# Patient Record
Sex: Male | Born: 1965 | Race: White | Hispanic: No | Marital: Married
Health system: Southern US, Community
[De-identification: ages and names within clinical notes are randomized; demographics above are authoritative.]

---

## 2017-01-13 DIAGNOSIS — L089 Local infection of the skin and subcutaneous tissue, unspecified: Secondary | ICD-10-CM | POA: Diagnosis not present

## 2017-01-13 DIAGNOSIS — Z1389 Encounter for screening for other disorder: Secondary | ICD-10-CM | POA: Diagnosis not present

## 2017-01-13 DIAGNOSIS — F4320 Adjustment disorder, unspecified: Secondary | ICD-10-CM | POA: Diagnosis not present

## 2017-01-13 DIAGNOSIS — L723 Sebaceous cyst: Secondary | ICD-10-CM | POA: Diagnosis not present

## 2017-01-15 DIAGNOSIS — L0211 Cutaneous abscess of neck: Secondary | ICD-10-CM | POA: Diagnosis not present

## 2017-01-15 DIAGNOSIS — L03221 Cellulitis of neck: Secondary | ICD-10-CM | POA: Diagnosis not present

## 2017-04-27 DIAGNOSIS — Z6825 Body mass index (BMI) 25.0-25.9, adult: Secondary | ICD-10-CM | POA: Diagnosis not present

## 2017-04-27 DIAGNOSIS — F4320 Adjustment disorder, unspecified: Secondary | ICD-10-CM | POA: Diagnosis not present

## 2017-04-27 DIAGNOSIS — K5090 Crohn's disease, unspecified, without complications: Secondary | ICD-10-CM | POA: Diagnosis not present

## 2017-11-16 DIAGNOSIS — F4320 Adjustment disorder, unspecified: Secondary | ICD-10-CM | POA: Diagnosis not present

## 2017-11-16 DIAGNOSIS — J101 Influenza due to other identified influenza virus with other respiratory manifestations: Secondary | ICD-10-CM | POA: Diagnosis not present

## 2018-03-19 DIAGNOSIS — L820 Inflamed seborrheic keratosis: Secondary | ICD-10-CM | POA: Diagnosis not present

## 2018-05-11 DIAGNOSIS — Z6826 Body mass index (BMI) 26.0-26.9, adult: Secondary | ICD-10-CM | POA: Diagnosis not present

## 2018-05-11 DIAGNOSIS — K5090 Crohn's disease, unspecified, without complications: Secondary | ICD-10-CM | POA: Diagnosis not present

## 2018-05-11 DIAGNOSIS — R1032 Left lower quadrant pain: Secondary | ICD-10-CM | POA: Diagnosis not present

## 2018-05-11 DIAGNOSIS — M722 Plantar fascial fibromatosis: Secondary | ICD-10-CM | POA: Diagnosis not present

## 2018-05-30 DIAGNOSIS — Z01818 Encounter for other preprocedural examination: Secondary | ICD-10-CM | POA: Diagnosis not present

## 2019-06-20 ENCOUNTER — Other Ambulatory Visit (HOSPITAL_COMMUNITY): Payer: Self-pay | Admitting: Neurological Surgery

## 2019-06-20 ENCOUNTER — Other Ambulatory Visit: Payer: Self-pay | Admitting: Neurological Surgery

## 2019-06-20 DIAGNOSIS — G8928 Other chronic postprocedural pain: Secondary | ICD-10-CM

## 2019-07-17 ENCOUNTER — Ambulatory Visit (HOSPITAL_COMMUNITY)
Admission: RE | Admit: 2019-07-17 | Discharge: 2019-07-17 | Disposition: A | Discharge status: Home / Self Care | Payer: No Typology Code available for payment source | Source: Ambulatory Visit | Attending: Neurological Surgery | Admitting: Neurological Surgery

## 2019-07-17 ENCOUNTER — Other Ambulatory Visit: Payer: Self-pay

## 2019-07-17 DIAGNOSIS — M5106 Intervertebral disc disorders with myelopathy, lumbar region: Secondary | ICD-10-CM | POA: Insufficient documentation

## 2019-07-17 DIAGNOSIS — K760 Fatty (change of) liver, not elsewhere classified: Secondary | ICD-10-CM | POA: Diagnosis not present

## 2019-07-17 DIAGNOSIS — K8020 Calculus of gallbladder without cholecystitis without obstruction: Secondary | ICD-10-CM | POA: Diagnosis not present

## 2019-07-17 DIAGNOSIS — M50021 Cervical disc disorder at C4-C5 level with myelopathy: Secondary | ICD-10-CM | POA: Diagnosis not present

## 2019-07-17 DIAGNOSIS — M4716 Other spondylosis with myelopathy, lumbar region: Secondary | ICD-10-CM | POA: Diagnosis present

## 2019-07-17 DIAGNOSIS — G8928 Other chronic postprocedural pain: Secondary | ICD-10-CM

## 2019-07-17 DIAGNOSIS — M4712 Other spondylosis with myelopathy, cervical region: Secondary | ICD-10-CM | POA: Insufficient documentation

## 2019-07-17 LAB — C-REACTIVE PROTEIN: CRP: 0.5 mg/dL (ref <1.0)

## 2019-07-17 LAB — SEDIMENTATION RATE: Sed Rate: 1 mm/hr (ref 0–16)

## 2019-07-17 MED ORDER — DIAZEPAM 5 MG PO TABS
ORAL_TABLET | ORAL | Status: AC
  Administered 2019-07-17: 10 mg via ORAL
  Filled 2019-07-17: qty 2

## 2019-07-17 MED ORDER — IOHEXOL 300 MG/ML  SOLN
10.0000 mL | Freq: Once | INTRAMUSCULAR | Status: AC | PRN
  Administered 2019-07-17: 7 mL via INTRATHECAL

## 2019-07-17 MED ORDER — DEXAMETHASONE 2 MG PO TABS
4.0000 mg | ORAL_TABLET | Freq: Once | ORAL | Status: AC
  Administered 2019-07-17: 4 mg via ORAL
  Filled 2019-07-17: qty 2

## 2019-07-17 MED ORDER — ONDANSETRON HCL 4 MG/2ML IJ SOLN: 4.0000 mg | Freq: Four times a day (QID) | INTRAMUSCULAR | Status: DC | PRN

## 2019-07-17 MED ORDER — DIAZEPAM 5 MG PO TABS: 10.0000 mg | ORAL_TABLET | Freq: Once | ORAL | Status: AC

## 2019-07-17 MED ORDER — LIDOCAINE HCL (PF) 1 % IJ SOLN
5.0000 mL | Freq: Once | INTRAMUSCULAR | Status: AC
  Administered 2019-07-17: 5 mL via INTRADERMAL

## 2019-07-17 MED ORDER — HYDROCODONE-ACETAMINOPHEN 5-325 MG PO TABS
1.0000 | ORAL_TABLET | ORAL | Status: DC | PRN
  Administered 2019-07-17: 2 via ORAL
  Filled 2019-07-17: qty 2

## 2019-07-17 NOTE — Procedures (Signed)
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  Back in December he underwent decompression at C5-6 and C6-C7 with an arthroplasty at those levels.  He tolerated surgery well but has had persistent neck pain and now has back pain with weakness in his lower extremities.  Because of the presence of the arthroplasties in his neck further study has been suggested and a myelogram post myelogram CAT scan is being performed to check the degree of decompression instability throughout his entire neural axis not only the cervical spine but also the thoracic and lumbar spines.  There is a family history of significant autoimmune phenomenon and there is concerned that he may have ankylosing spondylitis also and this is currently being worked up with a few lab tests.  Pre op Dx: Cervical spondylosis, lumbar spondylosis with myelopathy.  Cervical arthroplasty C5-6 and C6-C7 Post op Dx: Same Procedure: Total myelogram Surgeon: Mckynlie Vanderslice Puncture level: L3-4 Fluid color: Clear colorless Injection: Iohexol 300, 7 mL Findings: Moderate spondylosis in the cervical spine mild spondylitic changes in the lumbar spine further work-up with CT scanning arthroplasty is in place at C5-6 and C6-C7.

## 2019-07-17 NOTE — Discharge Instructions (Signed)
Myelogram and Lumbar Puncture Discharge Instructions  1. Go home and rest quietly for the next 24 hours.  It is important to lie flat for the next 24 hours.  Get up only to go to the restroom.  You may lie in the bed or on a couch on your back, your stomach, your left side or your right side.  You may have one pillow under your head.  You may have pillows between your knees while you are on your side or under your knees while you are on your back.  2. DO NOT drive today.  Recline the seat as far back as it will go, while still wearing your seat belt, on the way home.  3. You may get up to go to the bathroom as needed.  You may sit up for 10 minutes to eat.  You may resume your normal diet and medications unless otherwise indicated.  4. The incidence of headache, nausea, or vomiting is about 5% (one in 20 patients).  If you develop a headache, lie flat and drink plenty of fluids until the headache goes away.  Caffeinated beverages may be helpful.  If you develop severe nausea and vomiting or a headache that does not go away with flat bed rest, call [phone].  5. You may resume normal activities after your 24 hours of bed rest is over; however, do not exert yourself strongly or do any heavy lifting tomorrow.  6. Call your physician for a follow-up appointment.  The results of your myelogram will be sent directly to your physician by the following day.  7. If you have any questions or if complications develop after you arrive home, please call [phone].  Discharge instructions have been explained to the patient.  The patient, or the person responsible for the patient, fully understands these instructions.   

## 2020-10-22 IMAGING — CT CT CERVICAL SPINE W/ CM
3 of 4 series · 12 of 35 positions shown, 14 images · non-contrast
Comparison: none

CLINICAL DATA: Cervical and back pain. Right-sided extremity
weakness. History of Crohn's disease.
TECHNIQUE: Contiguous axial images were obtained through the Cervical,
Thoracic, and Lumbar spine after the intrathecal infusion of
infusion. Coronal and sagittal reconstructions were obtained of the
axial image sets.

[Series 9: sag bone · sagittal · 0.30mm/px · 5 of 214 slices shown, 6 images]
[im 72/214  bone]
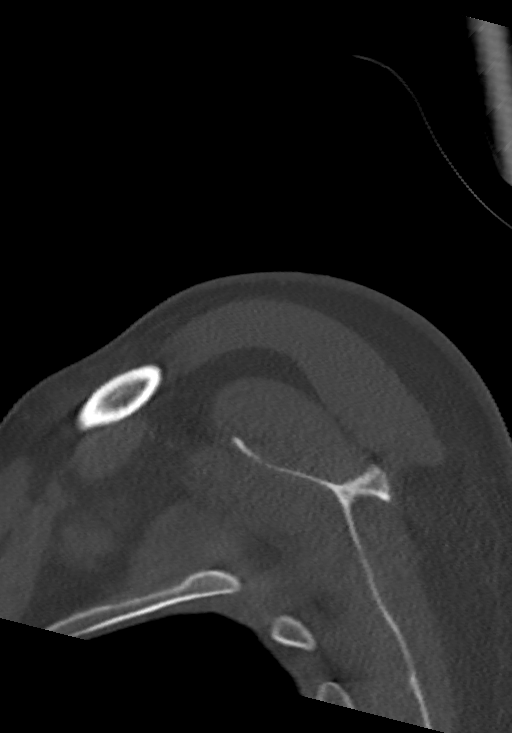
[im 89/214  bone]
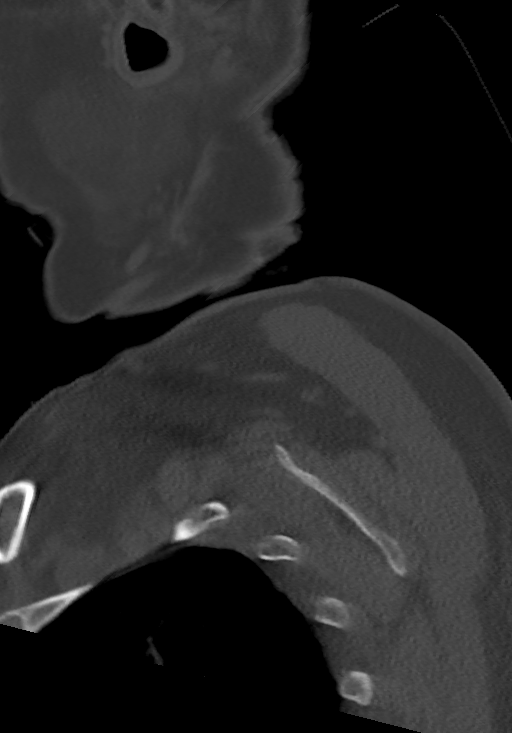
[im 107/214  soft-tissue]
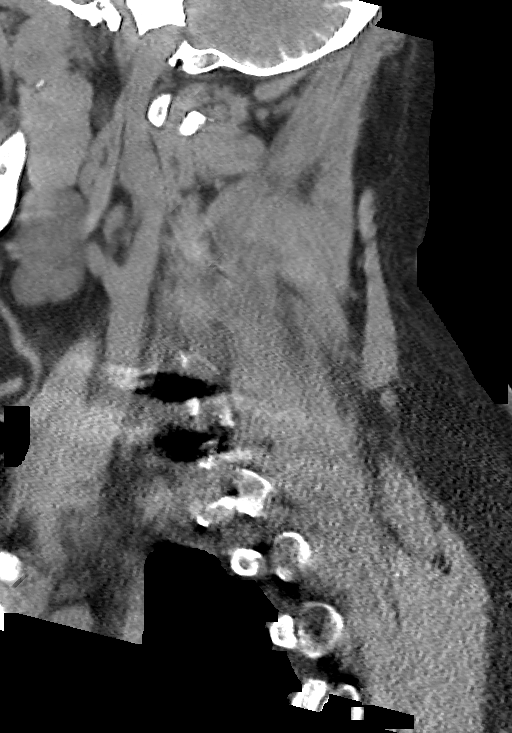
[im 107/214  bone]
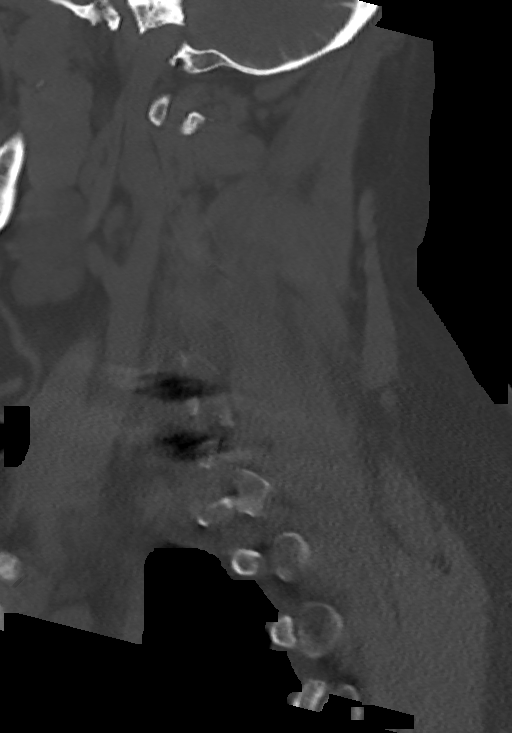
[im 125/214  bone]
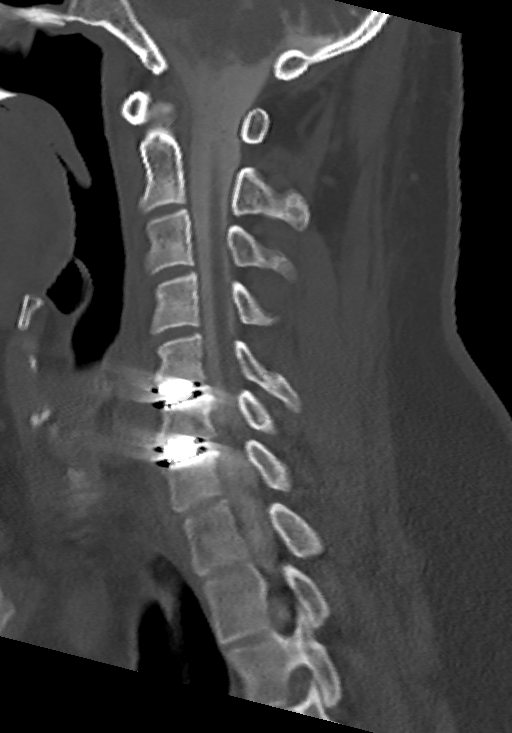
[im 143/214  bone]
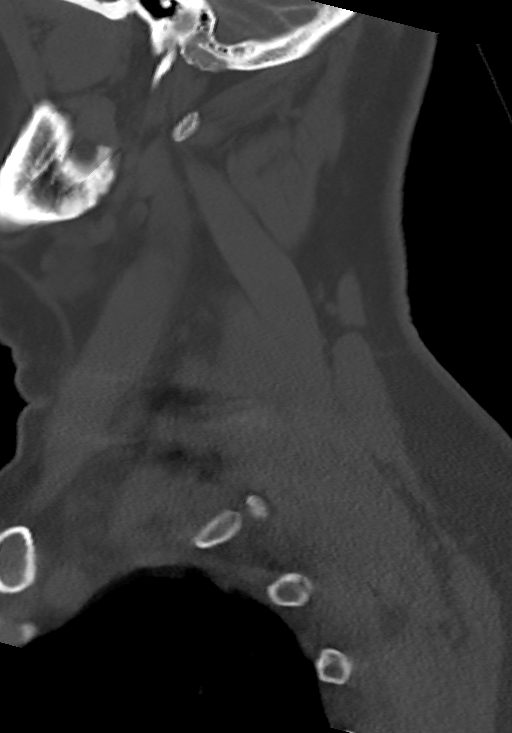

[Series 10: cor bone · coronal · 0.43mm/px · 3 of 77 slices shown]
[im 16/77  bone]
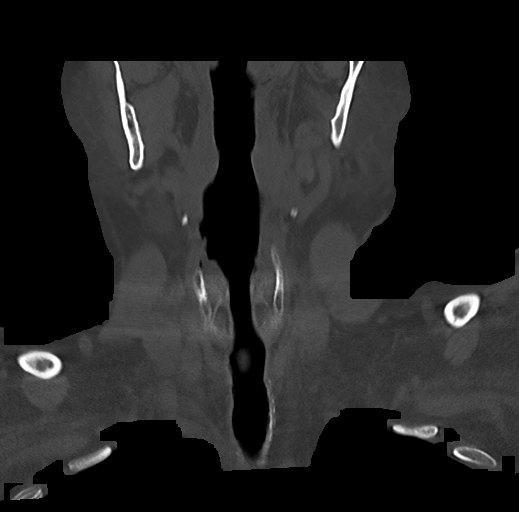
[im 31/77  bone]
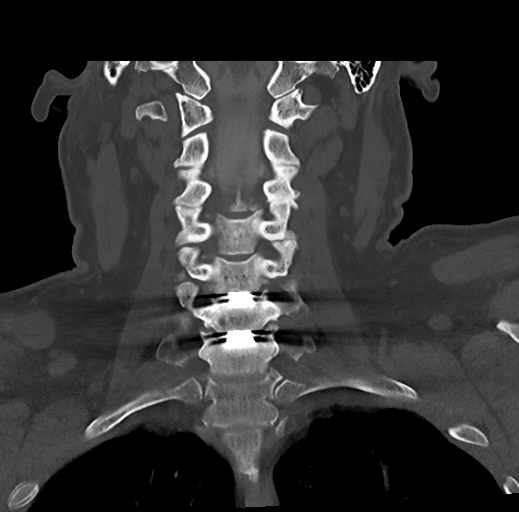
[im 46/77  bone]
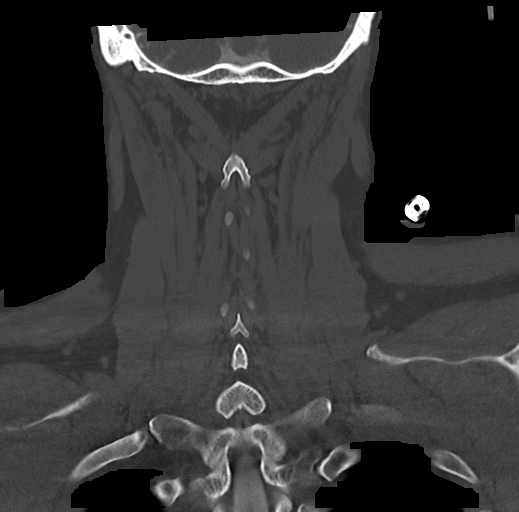

[Series 11: orthogonal axials · axial · 0.21mm/px · z∈[-309,-186]mm · 4 of 106 slices shown, 5 images]
[im 18/106  soft-tissue]
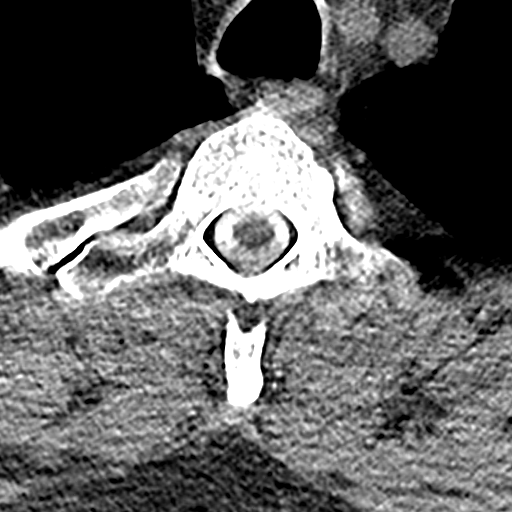
[im 18/106  bone]
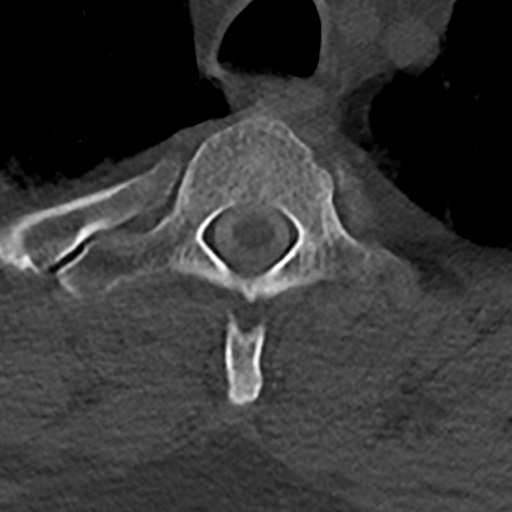
[im 36/106  bone]
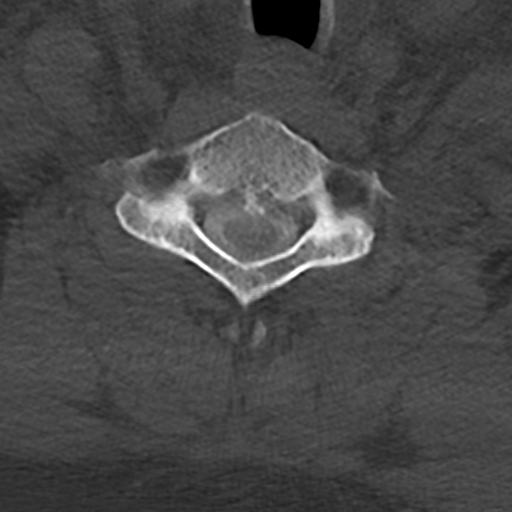
[im 71/106  bone]
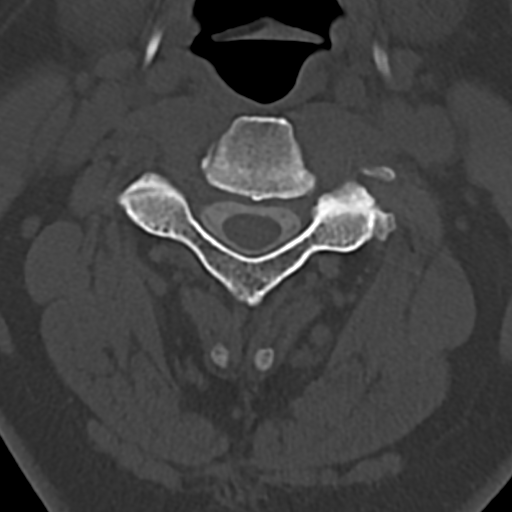
[im 88/106  bone]
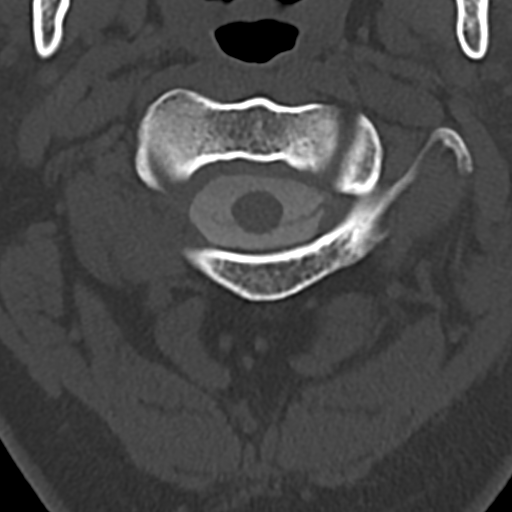

[12 of 35 positions shown; findings below may reference images not displayed]

FLUOROSCOPY TIME:  Reference procedure note

PROCEDURE:
CERVICAL AND LUMBAR AND THORACIC MYELOGRAM

CT CERVICAL MYELOGRAM

CT LUMBAR MYELOGRAM

CT THORACIC MYELOGRAM

Lumbar puncture and intrathecal contrast administration were
performed by Dr. Kevin Rosa who will separately report for the portion
of the procedure. I personally supervised acquisition of the
myelogram images.
FINDINGS: CERVICAL AND LUMBAR MYELOGRAM FINDINGS:

Free flow of intrathecal contrast within the thoracic and lumbar
spine. No block or significant narrowing was seen. No nerve root cut
off in the lumbar spine. Despite multiple maneuvers and maximal
table tilt, only faint opacification of the thecal sac in the
cervical spine. The patient has undergone C5-6 and C6-7 disc
arthroplasty with normally positioned spacers. No malalignment.

CT CERVICAL MYELOGRAM FINDINGS:

Alignment: Physiologic.

Vertebrae: Negative for fracture or bone lesion. No erosion. No
visible spondylitis.

Canal: Normal bulk and shape of the cord. No unexpected filling
defect or nerve root distortion.

Extra-spinal: Negative for incidental finding.

Disc levels:

C2-3: Trace facet spurring.  No impingement

C3-4: Mild left facet and uncovertebral spurring. Left foraminal
narrowing is mild. No impingement

C4-5: Small central protrusion with buttressing osteophyte that
contacts the ventral cord, stable from outside cervical myelogram
12/13/2018. The foramina are patent

C5-6: Disc arthroplasty with associated artifact. No visible cord or
foraminal impingement

C6-7: Disc arthroplasty with associated artifact. Uncovertebral
spurring with left more than right residual foraminal narrowing. The
canal is patent.

C7-T1:Unremarkable.

CT THORACIC MYELOGRAM FINDINGS:

Alignment: Normal.

Vertebrae: Negative for fracture, erosion, or bone lesion. No
evidence of spondylitis.

Cord: Normal bulk and shape. No abnormal intrathecal filling defect.

Extra-spinal:

Unremarkable

Disc levels:

Generally preserved disc height. Minor endplate spurring seen at
T2-3 and T3-4 primarily. Negative facets. No evidence of
impingement.

CT LUMBAR MYELOGRAM FINDINGS:

Segmentation: Normal.  There are 5 non-rib-bearing lumbar vertebrae.

Alignment: Minimal leftward curvature.  No listhesis.

Vertebrae: No evidence of fracture or bone lesion.

Conus: Tip terminates at L2. No abnormal intrathecal filling defect.

Extra-spinal:

Atherosclerosis, hepatic steatosis, and cholelithiasis. Bilateral
renal cystic densities.

Disc levels:

T12- L1: Unremarkable.

L1-L2: Foraminal cyst on the right with layering contrast. No
diffusing contrast for leak. No degenerative impingement

L2-L3: Unremarkable.

L3-L4: Disc narrowing with shallow right foraminal protrusion that
is noncompressive.

L4-L5: Bilateral foraminal predominant disc bulging. No neural
compression or impingement

L5-S1:Unremarkable.
IMPRESSION: Cervical spine:

1. Expected appearance of C5-6 and C6-7 disc arthroplasties. The
canal is patent at both levels. Left uncovertebral spurs continue to
encroach on the left foramen at C6-7.
2. Mild spinal stenosis from disc protrusion at C4-5, stable from
preoperative MRI.

Thoracic spine:

Unremarkable.

Lumbar spine:

1. L1-2 right perineural cyst filling/widening the foramen. There is
early filling of the cyst with no extravasation to suggest leak.
2. L3-4 noncompressive right foraminal protrusion.
3. Hepatic steatosis and cholelithiasis.

## 2021-03-22 DIAGNOSIS — M5412 Radiculopathy, cervical region: Secondary | ICD-10-CM

## 2021-03-22 DIAGNOSIS — M5030 Other cervical disc degeneration, unspecified cervical region: Secondary | ICD-10-CM

## 2021-03-22 DIAGNOSIS — F411 Generalized anxiety disorder: Secondary | ICD-10-CM

## 2021-03-22 DIAGNOSIS — G5601 Carpal tunnel syndrome, right upper limb: Secondary | ICD-10-CM | POA: Insufficient documentation

## 2021-03-22 HISTORY — DX: Other cervical disc degeneration, unspecified cervical region: M50.30

## 2021-03-22 HISTORY — DX: Radiculopathy, cervical region: M54.12

## 2021-03-22 HISTORY — DX: Carpal tunnel syndrome, right upper limb: G56.01

## 2021-03-22 HISTORY — DX: Generalized anxiety disorder: F41.1

## 2021-04-21 DIAGNOSIS — Z01818 Encounter for other preprocedural examination: Secondary | ICD-10-CM | POA: Diagnosis not present

## 2021-08-25 DIAGNOSIS — L720 Epidermal cyst: Secondary | ICD-10-CM | POA: Diagnosis not present

## 2021-08-25 DIAGNOSIS — L570 Actinic keratosis: Secondary | ICD-10-CM | POA: Diagnosis not present

## 2021-08-25 DIAGNOSIS — L820 Inflamed seborrheic keratosis: Secondary | ICD-10-CM | POA: Diagnosis not present

## 2021-08-25 DIAGNOSIS — L814 Other melanin hyperpigmentation: Secondary | ICD-10-CM | POA: Diagnosis not present

## 2021-08-25 DIAGNOSIS — L578 Other skin changes due to chronic exposure to nonionizing radiation: Secondary | ICD-10-CM | POA: Diagnosis not present

## 2021-08-31 DIAGNOSIS — M25511 Pain in right shoulder: Secondary | ICD-10-CM | POA: Diagnosis not present

## 2021-08-31 DIAGNOSIS — M5412 Radiculopathy, cervical region: Secondary | ICD-10-CM | POA: Diagnosis not present

## 2021-09-01 DIAGNOSIS — M4722 Other spondylosis with radiculopathy, cervical region: Secondary | ICD-10-CM | POA: Diagnosis not present

## 2021-09-01 DIAGNOSIS — R739 Hyperglycemia, unspecified: Secondary | ICD-10-CM | POA: Diagnosis not present

## 2021-09-01 DIAGNOSIS — Z6826 Body mass index (BMI) 26.0-26.9, adult: Secondary | ICD-10-CM | POA: Diagnosis not present

## 2021-09-01 DIAGNOSIS — D696 Thrombocytopenia, unspecified: Secondary | ICD-10-CM | POA: Diagnosis not present

## 2021-11-09 DIAGNOSIS — M545 Low back pain, unspecified: Secondary | ICD-10-CM | POA: Diagnosis not present

## 2021-11-23 DIAGNOSIS — M545 Low back pain, unspecified: Secondary | ICD-10-CM | POA: Diagnosis not present

## 2021-11-23 DIAGNOSIS — M4726 Other spondylosis with radiculopathy, lumbar region: Secondary | ICD-10-CM | POA: Diagnosis not present

## 2021-11-23 DIAGNOSIS — M5416 Radiculopathy, lumbar region: Secondary | ICD-10-CM | POA: Diagnosis not present

## 2021-11-25 DIAGNOSIS — M545 Low back pain, unspecified: Secondary | ICD-10-CM | POA: Diagnosis not present

## 2021-12-30 DIAGNOSIS — M1711 Unilateral primary osteoarthritis, right knee: Secondary | ICD-10-CM | POA: Diagnosis not present

## 2022-03-07 DIAGNOSIS — M4722 Other spondylosis with radiculopathy, cervical region: Secondary | ICD-10-CM | POA: Diagnosis not present

## 2022-03-07 DIAGNOSIS — Z6825 Body mass index (BMI) 25.0-25.9, adult: Secondary | ICD-10-CM | POA: Diagnosis not present

## 2022-03-07 DIAGNOSIS — J0120 Acute ethmoidal sinusitis, unspecified: Secondary | ICD-10-CM | POA: Diagnosis not present

## 2022-03-07 DIAGNOSIS — R69 Illness, unspecified: Secondary | ICD-10-CM | POA: Diagnosis not present

## 2022-04-01 DIAGNOSIS — M1711 Unilateral primary osteoarthritis, right knee: Secondary | ICD-10-CM | POA: Diagnosis not present

## 2022-04-26 DIAGNOSIS — M7541 Impingement syndrome of right shoulder: Secondary | ICD-10-CM | POA: Diagnosis not present

## 2022-04-27 DIAGNOSIS — D696 Thrombocytopenia, unspecified: Secondary | ICD-10-CM | POA: Diagnosis not present

## 2022-04-27 DIAGNOSIS — K5090 Crohn's disease, unspecified, without complications: Secondary | ICD-10-CM | POA: Diagnosis not present

## 2022-04-27 DIAGNOSIS — E1165 Type 2 diabetes mellitus with hyperglycemia: Secondary | ICD-10-CM | POA: Diagnosis not present

## 2022-05-10 DIAGNOSIS — Z713 Dietary counseling and surveillance: Secondary | ICD-10-CM | POA: Diagnosis not present

## 2022-05-10 DIAGNOSIS — E1165 Type 2 diabetes mellitus with hyperglycemia: Secondary | ICD-10-CM | POA: Diagnosis not present

## 2022-06-08 DIAGNOSIS — E1165 Type 2 diabetes mellitus with hyperglycemia: Secondary | ICD-10-CM | POA: Diagnosis not present

## 2022-07-18 DIAGNOSIS — N138 Other obstructive and reflux uropathy: Secondary | ICD-10-CM | POA: Diagnosis not present

## 2022-07-18 DIAGNOSIS — N200 Calculus of kidney: Secondary | ICD-10-CM | POA: Diagnosis not present

## 2022-08-02 DIAGNOSIS — L720 Epidermal cyst: Secondary | ICD-10-CM | POA: Diagnosis not present

## 2022-08-02 DIAGNOSIS — L820 Inflamed seborrheic keratosis: Secondary | ICD-10-CM | POA: Diagnosis not present

## 2022-09-01 DIAGNOSIS — M5412 Radiculopathy, cervical region: Secondary | ICD-10-CM | POA: Diagnosis not present

## 2022-09-06 DIAGNOSIS — E1165 Type 2 diabetes mellitus with hyperglycemia: Secondary | ICD-10-CM | POA: Diagnosis not present

## 2022-09-20 DIAGNOSIS — Z6825 Body mass index (BMI) 25.0-25.9, adult: Secondary | ICD-10-CM | POA: Diagnosis not present

## 2022-09-20 DIAGNOSIS — J329 Chronic sinusitis, unspecified: Secondary | ICD-10-CM | POA: Diagnosis not present

## 2022-12-01 DIAGNOSIS — M7541 Impingement syndrome of right shoulder: Secondary | ICD-10-CM | POA: Diagnosis not present

## 2022-12-01 DIAGNOSIS — M25511 Pain in right shoulder: Secondary | ICD-10-CM | POA: Diagnosis not present

## 2022-12-07 DIAGNOSIS — M7581 Other shoulder lesions, right shoulder: Secondary | ICD-10-CM | POA: Diagnosis not present

## 2022-12-07 DIAGNOSIS — M25511 Pain in right shoulder: Secondary | ICD-10-CM | POA: Diagnosis not present

## 2022-12-07 DIAGNOSIS — M778 Other enthesopathies, not elsewhere classified: Secondary | ICD-10-CM | POA: Diagnosis not present

## 2022-12-15 DIAGNOSIS — M7541 Impingement syndrome of right shoulder: Secondary | ICD-10-CM | POA: Diagnosis not present

## 2022-12-15 DIAGNOSIS — M25511 Pain in right shoulder: Secondary | ICD-10-CM | POA: Diagnosis not present

## 2022-12-21 DIAGNOSIS — M25511 Pain in right shoulder: Secondary | ICD-10-CM | POA: Diagnosis not present

## 2023-04-19 HISTORY — PX: GALLBLADDER SURGERY: SHX652

## 2023-04-25 DIAGNOSIS — M25511 Pain in right shoulder: Secondary | ICD-10-CM | POA: Diagnosis not present

## 2023-05-04 ENCOUNTER — Telehealth: Payer: Self-pay | Admitting: Gastroenterology

## 2023-05-04 NOTE — Telephone Encounter (Signed)
Good afternoon Dr. Chales Abrahams,   I received a call from this patient requesting to make an appointment for his hernia. Patient was last seen by Dr. Jennye Boroughs.Patient stated that he no longer want to continue with his care with Dr. Jennye Boroughs. Would you please advise on scheduling.    Thank You .

## 2023-05-16 NOTE — Telephone Encounter (Signed)
OK to schedule in my clinic or APP clinic, whichever is earlier Please get records from Dr. Tempie Hoist

## 2023-06-14 DIAGNOSIS — K432 Incisional hernia without obstruction or gangrene: Secondary | ICD-10-CM | POA: Diagnosis not present

## 2023-06-14 DIAGNOSIS — F4320 Adjustment disorder, unspecified: Secondary | ICD-10-CM | POA: Diagnosis not present

## 2023-06-14 DIAGNOSIS — R1011 Right upper quadrant pain: Secondary | ICD-10-CM | POA: Diagnosis not present

## 2023-06-14 DIAGNOSIS — M4722 Other spondylosis with radiculopathy, cervical region: Secondary | ICD-10-CM | POA: Diagnosis not present

## 2023-06-14 DIAGNOSIS — K5090 Crohn's disease, unspecified, without complications: Secondary | ICD-10-CM | POA: Diagnosis not present

## 2023-06-14 DIAGNOSIS — Z6825 Body mass index (BMI) 25.0-25.9, adult: Secondary | ICD-10-CM | POA: Diagnosis not present

## 2023-06-27 DIAGNOSIS — R1013 Epigastric pain: Secondary | ICD-10-CM | POA: Diagnosis not present

## 2023-06-28 DIAGNOSIS — R1013 Epigastric pain: Secondary | ICD-10-CM | POA: Diagnosis not present

## 2023-06-28 DIAGNOSIS — K8020 Calculus of gallbladder without cholecystitis without obstruction: Secondary | ICD-10-CM | POA: Diagnosis not present

## 2023-06-30 DIAGNOSIS — K8010 Calculus of gallbladder with chronic cholecystitis without obstruction: Secondary | ICD-10-CM | POA: Diagnosis not present

## 2023-06-30 DIAGNOSIS — R160 Hepatomegaly, not elsewhere classified: Secondary | ICD-10-CM | POA: Diagnosis not present

## 2023-07-11 DIAGNOSIS — K769 Liver disease, unspecified: Secondary | ICD-10-CM | POA: Diagnosis not present

## 2023-07-11 DIAGNOSIS — R9389 Abnormal findings on diagnostic imaging of other specified body structures: Secondary | ICD-10-CM | POA: Diagnosis not present

## 2023-07-12 DIAGNOSIS — K769 Liver disease, unspecified: Secondary | ICD-10-CM | POA: Diagnosis not present

## 2023-07-24 DIAGNOSIS — Z6825 Body mass index (BMI) 25.0-25.9, adult: Secondary | ICD-10-CM | POA: Diagnosis not present

## 2023-07-24 DIAGNOSIS — H9201 Otalgia, right ear: Secondary | ICD-10-CM | POA: Diagnosis not present

## 2023-08-01 DIAGNOSIS — E119 Type 2 diabetes mellitus without complications: Secondary | ICD-10-CM | POA: Diagnosis not present

## 2023-08-01 DIAGNOSIS — R160 Hepatomegaly, not elsewhere classified: Secondary | ICD-10-CM | POA: Diagnosis not present

## 2023-08-01 DIAGNOSIS — G8918 Other acute postprocedural pain: Secondary | ICD-10-CM | POA: Diagnosis not present

## 2023-08-01 DIAGNOSIS — K219 Gastro-esophageal reflux disease without esophagitis: Secondary | ICD-10-CM | POA: Diagnosis not present

## 2023-08-01 DIAGNOSIS — Z79899 Other long term (current) drug therapy: Secondary | ICD-10-CM | POA: Diagnosis not present

## 2023-08-01 DIAGNOSIS — K8010 Calculus of gallbladder with chronic cholecystitis without obstruction: Secondary | ICD-10-CM | POA: Diagnosis not present

## 2023-08-07 DIAGNOSIS — R7989 Other specified abnormal findings of blood chemistry: Secondary | ICD-10-CM | POA: Diagnosis not present

## 2023-08-07 DIAGNOSIS — D72829 Elevated white blood cell count, unspecified: Secondary | ICD-10-CM | POA: Diagnosis not present

## 2023-08-07 DIAGNOSIS — R17 Unspecified jaundice: Secondary | ICD-10-CM | POA: Diagnosis not present

## 2023-08-07 DIAGNOSIS — Z9049 Acquired absence of other specified parts of digestive tract: Secondary | ICD-10-CM | POA: Diagnosis not present

## 2023-08-08 DIAGNOSIS — R630 Anorexia: Secondary | ICD-10-CM | POA: Diagnosis not present

## 2023-08-08 DIAGNOSIS — R161 Splenomegaly, not elsewhere classified: Secondary | ICD-10-CM | POA: Diagnosis not present

## 2023-08-08 DIAGNOSIS — Z9049 Acquired absence of other specified parts of digestive tract: Secondary | ICD-10-CM | POA: Diagnosis not present

## 2023-08-08 DIAGNOSIS — N281 Cyst of kidney, acquired: Secondary | ICD-10-CM | POA: Diagnosis not present

## 2023-08-08 DIAGNOSIS — R109 Unspecified abdominal pain: Secondary | ICD-10-CM | POA: Diagnosis not present

## 2023-08-14 DIAGNOSIS — D72829 Elevated white blood cell count, unspecified: Secondary | ICD-10-CM | POA: Diagnosis not present

## 2023-08-14 DIAGNOSIS — Z9049 Acquired absence of other specified parts of digestive tract: Secondary | ICD-10-CM | POA: Diagnosis not present

## 2023-08-14 DIAGNOSIS — R7989 Other specified abnormal findings of blood chemistry: Secondary | ICD-10-CM | POA: Diagnosis not present

## 2023-10-17 DIAGNOSIS — M7501 Adhesive capsulitis of right shoulder: Secondary | ICD-10-CM | POA: Diagnosis not present

## 2023-12-19 DIAGNOSIS — Z1339 Encounter for screening examination for other mental health and behavioral disorders: Secondary | ICD-10-CM | POA: Diagnosis not present

## 2023-12-19 DIAGNOSIS — R0609 Other forms of dyspnea: Secondary | ICD-10-CM | POA: Diagnosis not present

## 2023-12-19 DIAGNOSIS — F4320 Adjustment disorder, unspecified: Secondary | ICD-10-CM | POA: Diagnosis not present

## 2023-12-19 DIAGNOSIS — Z1331 Encounter for screening for depression: Secondary | ICD-10-CM | POA: Diagnosis not present

## 2023-12-19 DIAGNOSIS — M5412 Radiculopathy, cervical region: Secondary | ICD-10-CM | POA: Diagnosis not present

## 2023-12-19 DIAGNOSIS — E782 Mixed hyperlipidemia: Secondary | ICD-10-CM | POA: Diagnosis not present

## 2023-12-19 DIAGNOSIS — Z6824 Body mass index (BMI) 24.0-24.9, adult: Secondary | ICD-10-CM | POA: Diagnosis not present

## 2023-12-19 DIAGNOSIS — E1169 Type 2 diabetes mellitus with other specified complication: Secondary | ICD-10-CM | POA: Diagnosis not present

## 2023-12-21 ENCOUNTER — Other Ambulatory Visit: Payer: Self-pay

## 2023-12-22 ENCOUNTER — Ambulatory Visit

## 2023-12-22 VITALS — BP 120/62 | HR 62 | Ht 71.0 in | Wt 183.8 lb

## 2023-12-22 DIAGNOSIS — R0600 Dyspnea, unspecified: Secondary | ICD-10-CM | POA: Diagnosis not present

## 2023-12-22 NOTE — Assessment & Plan Note (Signed)
 Associated with a sense of fatigue and tiredness over the past 6 months noticeably with exertion.  Does have cardiovascular risk factors.  Proceed with transthoracic echocardiogram to rule out cardiac structural and functional abnormalities and Lexiscan nuclear imaging stress test [with his cervical spine fusion and neck pain he is hesitant to do a treadmill stress test].

## 2023-12-22 NOTE — Patient Instructions (Signed)
 Medication Instructions:  Your physician recommends that you continue on your current medications as directed. Please refer to the Current Medication list given to you today.   *If you need a refill on your cardiac medications before your next appointment, please call your pharmacy*   Lab Work: None ordered  If you have labs (blood work) drawn today and your tests are completely normal, you will receive your results only by: MyChart Message (if you have MyChart) OR A paper copy in the mail If you have any lab test that is abnormal or we need to change your treatment, we will call you to review the results.   Testing/Procedures:   [facility] at [facility] [address] [place], [place] [address] Phone: [phone]   Please arrive 15 minutes prior to your appointment time for registration and insurance purposes.  The test will take approximately 3 to 4 hours to complete; you may bring reading material.  If someone comes with you to your appointment, they will need to remain in the main lobby due to limited space in the testing area. **If you are pregnant or breastfeeding, please notify the nuclear lab prior to your appointment**  How to prepare for your Myocardial Perfusion Test: Do not eat or drink 3 hours prior to your test, except you may have water. Do not consume products containing caffeine (regular or decaffeinated) 12 hours prior to your test. (ex: coffee, chocolate, sodas, tea). Do bring a list of your current medications with you.  If not listed below, you may take your medications as normal. Do wear comfortable clothes (no dresses or overalls) and walking shoes, tennis shoes preferred (No heels or open toe shoes are allowed). Do NOT wear cologne, perfume, aftershave, or lotions (deodorant is allowed). If these instructions are not followed, your test will have to be rescheduled.  If you cannot keep your appointment, please provide 24 hours  notification to the Nuclear Lab, to avoid a possible $50 charge to your account.     Your physician has requested that you have an echocardiogram. Echocardiography is a painless test that uses sound waves to create images of your heart. It provides your doctor with information about the size and shape of your heart and how well your heart's chambers and valves are working. This procedure takes approximately one hour. There are no restrictions for this procedure. Please do NOT wear cologne, perfume, aftershave, or lotions (deodorant is allowed). Please arrive 15 minutes prior to your appointment time.  Please note: We ask at that you not bring children with you during ultrasound (echo/ vascular) testing. Due to room size and safety concerns, children are not allowed in the ultrasound rooms during exams. Our front office staff cannot provide observation of children in our lobby area while testing is being conducted. An adult accompanying a patient to their appointment will only be allowed in the ultrasound room at the discretion of the ultrasound technician under special circumstances. We apologize for any inconvenience.    Follow-Up: At [facility], you and your health needs are our priority.  As part of our continuing mission to provide you with exceptional heart care, we have created designated Provider Care Teams.  These Care Teams include your primary Cardiologist (physician) and Advanced Practice Providers (APPs -  Physician Assistants and Nurse Practitioners) who all work together to provide you with the care you need, when you need it.  We recommend signing up for the patient portal called "MyChart".  Sign  up information is provided on this After Visit Summary.  MyChart is used to connect with patients for Virtual Visits (Telemedicine).  Patients are able to view lab/test results, encounter notes, upcoming appointments, etc.  Non-urgent messages can be sent to your provider as well.    To learn more about what you can do with MyChart, go to [email or web address].    Your next appointment:   2 month(s)  Provider:   Bertha Broad, MD   Other Instructions  Cardiac Nuclear Scan A cardiac nuclear scan is a test that is done to check the flow of blood to your heart. It is done when you are resting and when you are exercising. The test looks for problems such as: Not enough blood reaching a portion of the heart. The heart muscle not working as it should. You may need this test if you have: Heart disease. Lab results that are not normal. Had heart surgery or a balloon procedure to open up blocked arteries (angioplasty) or a small mesh tube (stent). Chest pain. Shortness of breath. Had a heart attack. In this test, a special dye (tracer) is put into your bloodstream. The tracer will travel to your heart. A camera will then take pictures of your heart to see how the tracer moves through your heart. This test is usually done at a hospital and takes 2-4 hours. Tell a doctor about: Any allergies you have. All medicines you are taking, including vitamins, herbs, eye drops, creams, and over-the-counter medicines. Any bleeding problems you have. Any surgeries you have had. Any medical conditions you have. Whether you are pregnant or may be pregnant. Any history of asthma or long-term (chronic) lung disease. Any history of heart rhythm disorders or heart valve conditions. What are the risks? Your doctor will talk with you about risks. These may include: Serious chest pain and heart attack. This is only a risk if the stress portion of the test is done. Fast or uneven heartbeats (palpitations). A feeling of warmth in your chest. This feeling usually does not last long. Allergic reaction to the tracer. Shortness of breath or trouble breathing. What happens before the test? Ask your doctor about changing or stopping your normal medicines. Follow instructions from your  doctor about what you cannot eat or drink. Remove your jewelry on the day of the test. Ask your doctor if you need to avoid nicotine or caffeine. What happens during the test? An IV tube will be inserted into one of your veins. Your doctor will give you a small amount of tracer through the IV tube. You will wait for 20-40 minutes while the tracer moves through your bloodstream. Your heart will be monitored with an electrocardiogram (ECG). You will lie down on an exam table. Pictures of your heart will be taken for about 15-20 minutes. You may also have a stress test. For this test, one of these things may be done: You will be asked to exercise on a treadmill or a stationary bike. You will be given medicines that will make your heart work harder. This is done if you are unable to exercise. When blood flow to your heart has peaked, a tracer will again be given through the IV tube. After 20-40 minutes, you will get back on the exam table. More pictures will be taken of your heart. Depending on the tracer that is used, more pictures may need to be taken 3-4 hours later. Your IV tube will be removed when the test is over. The test  may vary among doctors and hospitals. What happens after the test? Ask your doctor: Whether you can return to your normal schedule, including diet, activities, travel, and medicines. Whether you should drink more fluids. This will help to remove the tracer from your body. Ask your doctor, or the department that is doing the test: When will my results be ready? How will I get my results? What are my treatment options? What other tests do I need? What are my next steps? This information is not intended to replace advice given to you by your health care provider. Make sure you discuss any questions you have with your health care provider. Document Revised: 08/31/2021 Document Reviewed: 08/31/2021 Elsevier Patient Education  2023 Else[employer] Inc.  Echocardiogram An  echocardiogram is a test that uses sound waves (ultrasound) to produce images of the heart. Images from an echocardiogram can provide important information about: Heart size and shape. The size and thickness and movement of your heart's walls. Heart muscle function and strength. Heart valve function or if you have stenosis. Stenosis is when the heart valves are too narrow. If blood is flowing backward through the heart valves (regurgitation). A tumor or infectious growth around the heart valves. Areas of heart muscle that are not working well because of poor blood flow or injury from a heart attack. Aneurysm detection. An aneurysm is a weak or damaged part of an artery wall. The wall bulges out from the normal force of blood pumping through the body. Tell a health care provider about: Any allergies you have. All medicines you are taking, including vitamins, herbs, eye drops, creams, and over-the-counter medicines. Any blood disorders you have. Any surgeries you have had. Any medical conditions you have. Whether you are pregnant or may be pregnant. What are the risks? Generally, this is a safe test. However, problems may occur, including an allergic reaction to dye (contrast) that may be used during the test. What happens before the test? No specific preparation is needed. You may eat and drink normally. What happens during the test?  You will take off your clothes from the waist up and put on a hospital gown. Electrodes or electrocardiogram (ECG)patches may be placed on your chest. The electrodes or patches are then connected to a device that monitors your heart rate and rhythm. You will lie down on a table for an ultrasound exam. A gel will be applied to your chest to help sound waves pass through your skin. A handheld device, called a transducer, will be pressed against your chest and moved over your heart. The transducer produces sound waves that travel to your heart and bounce back (or  "echo" back) to the transducer. These sound waves will be captured in real-time and changed into images of your heart that can be viewed on a video monitor. The images will be recorded on a computer and reviewed by your health care provider. You may be asked to change positions or hold your breath for a short time. This makes it easier to get different views or better views of your heart. In some cases, you may receive contrast through an IV in one of your veins. This can improve the quality of the pictures from your heart. The procedure may vary among health care providers and hospitals. What can I expect after the test? You may return to your normal, everyday life, including diet, activities, and medicines, unless your health care provider tells you not to do that. Follow these instructions at home: It  is up to you to get the results of your test. Ask your health care provider, or the department that is doing the test, when your results will be ready. Keep all follow-up visits. This is important. Summary An echocardiogram is a test that uses sound waves (ultrasound) to produce images of the heart. Images from an echocardiogram can provide important information about the size and shape of your heart, heart muscle function, heart valve function, and other possible heart problems. You do not need to do anything to prepare before this test. You may eat and drink normally. After the echocardiogram is completed, you may return to your normal, everyday life, unless your health care provider tells you not to do that. This information is not intended to replace advice given to you by your health care provider. Make sure you discuss any questions you have with your health care provider. Document Revised: 12/16/2020 Document Reviewed: 11/26/2019 Elsevier Patient Education  2023 [employer] Inc[employer]    Important Information About Sugar

## 2023-12-22 NOTE — Progress Notes (Signed)
 Cardiology Consultation:    Date:  12/22/2023   ID:  Fernando George, DOB 26-Dec-1965, MRN [id number]  PCP:  Patient, No Pcp Per  Cardiologist:  Fernando JONELLE Kobus, MD   Referring MD: Fernando Tracey LABOR, MD   No chief complaint on file.    ASSESSMENT AND PLAN:   Fernando George 58 year old male history of diabetes [hemoglobin A1c 6.6 recently] currently being managed with diet, dyslipidemia, Crohn's disease s/p colectomy in 2012, s/p cholecystectomy in 2025, remote history of stress test [records not available and reportedly normal per patient], cervical spine fusion 2 years ago.  Quit alcohol about 2 years ago.  Quit smoking in 2012.    Problem List Items Addressed This Visit     Dyspnea - Primary   Associated with a sense of fatigue and tiredness over the past 6 months noticeably with exertion.  Does have cardiovascular risk factors.  Proceed with transthoracic echocardiogram to rule out cardiac structural and functional abnormalities and Lexiscan nuclear imaging stress test [with his cervical spine fusion and neck pain he is hesitant to do a treadmill stress test].        Relevant Orders   EKG 12-Lead (Completed)   ECHOCARDIOGRAM COMPLETE   MYOCARDIAL PERFUSION IMAGING   Return to clinic tentatively in 2 months   History of Present Illness:    Fernando George is a 58 y.o. male who is being seen today for the evaluation of symptoms of weakness and tiredness/fatigue worse with activity at the request of Fernando Tracey LABOR, MD.  Pleasant man evaluated by himself.  Lives with his wife at home.  Currently on disability.  Keeps himself  busy with household activities and yard work.  Has history of diabetes [hemoglobin A1c 6.6 recently] currently being managed with diet, dyslipidemia, Crohn's disease s/p colectomy in 2012, s/p cholecystectomy in 2025, remote history of stress test [records not available and reportedly normal per patient], cervical spine fusion 2 years ago.  Quit alcohol about  2 years ago.  Quit smoking in 2012.  Mentions here for further evaluation of symptoms of fatigue and tiredness associated with exertion noticeable for the past 6 months Gradually progressive. Denies any symptoms of chest pain or palpitations however gets more easily tired and some sensation of being out of breath. Relieved with rest. No palpitations, lightheadedness, syncopal episodes.  EKG in the clinic today showed sinus rhythm heart rate 62/min, PR interval 150 s, QRS duration 82 ms, QTc 480 ms.  No ischemic changes.  Mentions family history of father in his 28s died from complications of heart failure and renal failure.  Aspirin associated with side effects of stomach upset, willing to try small dose aspirin if indicated and would consider this with GI protection if needed.   Past Medical History:  Diagnosis Date   Carpal tunnel syndrome, right 03/22/2021   Cervical radiculopathy 03/22/2021   DDD (degenerative disc disease), cervical 03/22/2021   Generalized anxiety disorder 03/22/2021    Past Surgical History:  Procedure Laterality Date   GALLBLADDER SURGERY  04/2023    Current Medications: Current Meds  Medication Sig   acetaminophen  (TYLENOL ) 500 MG tablet Take 1,000 mg by mouth daily as needed for moderate pain or headache.   ALPRAZolam (XANAX) 0.5 MG tablet Take 0.5 mg by mouth at bedtime.   Cholecalciferol (VITAMIN D) 50 MCG (2000 UT) tablet Take 2,000 Units by mouth daily.   diclofenac (VOLTAREN) 50 MG EC tablet Take 50 mg by mouth 2 (two) times daily.  gabapentin (NEURONTIN) 100 MG capsule Take 100-200 mg by mouth at bedtime as needed.   loperamide (IMODIUM A-D) 2 MG tablet Take 4 mg by mouth daily at 12 noon.   methocarbamol  (ROBAXIN ) 500 MG tablet Take 500 mg by mouth 2 (two) times daily as needed for muscle spasms.   promethazine (PHENERGAN) 25 MG tablet Take 25 mg by mouth every 6 (six) hours as needed for refractory nausea / vomiting, vomiting or nausea.    tamsulosin (FLOMAX) 0.4 MG CAPS capsule Take 0.4 mg by mouth daily.     Allergies:   Aspirin, Codeine, and Toradol [ketorolac tromethamine]   Social History   Socioeconomic History   Marital status: Married    Spouse name: Not on file   Number of children: Not on file   Years of education: Not on file   Highest education level: Not on file  Occupational History   Not on file  Tobacco Use   Smoking status: Never   Smokeless tobacco: Never  Substance and Sexual Activity   Alcohol use: Not on file   Drug use: Not on file   Sexual activity: Not on file  Other Topics Concern   Not on file  Social History Narrative   Not on file   Social Drivers of Health   Financial Resource Strain: Not on file  Food Insecurity: Not on file  Transportation Needs: Not on file  Physical Activity: Not on file  Stress: Not on file  Social Connections: Unknown (08/31/2021)   Received from [facility]   Social Network    Social Network: Not on file     Family History: The patient's family history includes Heart failure in his father. ROS:   Please see the history of present illness.    All 14 point review of systems negative except as described per history of present illness.  EKGs/Labs/Other Studies Reviewed:    The following studies were reviewed today:   EKG:  EKG Interpretation Date/Time:  Friday December 22 2023 10:13:55 EDT Ventricular Rate:  62 PR Interval:  150 QRS Duration:  82 QT Interval:  412 QTC Calculation: 418 R Axis:   74  Text Interpretation: Normal sinus rhythm Normal ECG No previous ECGs available Confirmed by Fernando George [id number]) on 12/22/2023 10:48:16 AM    Recent Labs: No results found for requested labs within last 365 days.  Recent Lipid Panel No results found for: CHOL, TRIG, HDL, CHOLHDL, VLDL, LDLCALC, LDLDIRECT  Physical Exam:    VS:  BP 120/62   Pulse 62   Ht 5' 11 (1.803 m)   Wt 183 lb 12.8 oz (83.4 kg)   BMI 25.63  kg/m     Wt Readings from Last 3 Encounters:  12/22/23 183 lb 12.8 oz (83.4 kg)  07/17/19 194 lb (88 kg)     GENERAL:  Well nourished, well developed in no acute distress NECK: No JVD; No carotid bruits CARDIAC: RRR, S1 and S2 present, no murmurs, no rubs, no gallops CHEST:  Clear to auscultation without rales, wheezing or rhonchi  Extremities: No pitting pedal edema. Pulses bilaterally symmetric with radial 2+ and dorsalis pedis 2+ NEUROLOGIC:  Alert and oriented x 3  Medication Adjustments/Labs and Tests Ordered: Current medicines are reviewed at length with the patient today.  Concerns regarding medicines are outlined above.  Orders Placed This Encounter  Procedures   MYOCARDIAL PERFUSION IMAGING   EKG 12-Lead   ECHOCARDIOGRAM COMPLETE   No orders of the defined types were  placed in this encounter.   Signed, Fernando jess Kobus, MD, MPH, [facility]. 12/22/2023 11:08 AM    [facility]

## 2023-12-26 ENCOUNTER — Telehealth: Payer: Self-pay

## 2023-12-26 NOTE — Telephone Encounter (Signed)
 Patient canceled upcoming appointments stating he did not want these appointments at this time.

## 2023-12-27 NOTE — Telephone Encounter (Signed)
Letter sent to Dr. Welton Flakes

## 2023-12-28 ENCOUNTER — Telehealth (HOSPITAL_COMMUNITY): Payer: Self-pay

## 2023-12-28 NOTE — Telephone Encounter (Signed)
 Patient cancelled Myoview and echocardiogram for reason below:  12/26/2023 2:43 PM Ab:TPODNW, JASMIN B  Cancel Rsn: Patient (stated he did not want this appointment at this time)   Order will be removed from the WQ and f patient calls back we will reinstate the order. Thank you  Olam

## 2024-01-01 DIAGNOSIS — M5412 Radiculopathy, cervical region: Secondary | ICD-10-CM | POA: Diagnosis not present

## 2024-01-01 DIAGNOSIS — M5030 Other cervical disc degeneration, unspecified cervical region: Secondary | ICD-10-CM | POA: Diagnosis not present

## 2024-01-03 DIAGNOSIS — J029 Acute pharyngitis, unspecified: Secondary | ICD-10-CM | POA: Diagnosis not present

## 2024-01-03 DIAGNOSIS — R070 Pain in throat: Secondary | ICD-10-CM | POA: Diagnosis not present

## 2024-01-03 DIAGNOSIS — J309 Allergic rhinitis, unspecified: Secondary | ICD-10-CM | POA: Diagnosis not present

## 2024-01-09 ENCOUNTER — Encounter

## 2024-01-09 DIAGNOSIS — M5412 Radiculopathy, cervical region: Secondary | ICD-10-CM | POA: Diagnosis not present

## 2024-01-09 DIAGNOSIS — G5600 Carpal tunnel syndrome, unspecified upper limb: Secondary | ICD-10-CM | POA: Diagnosis not present

## 2024-01-09 DIAGNOSIS — G5620 Lesion of ulnar nerve, unspecified upper limb: Secondary | ICD-10-CM | POA: Diagnosis not present

## 2024-01-16 ENCOUNTER — Other Ambulatory Visit

## 2024-02-26 ENCOUNTER — Ambulatory Visit

## 2024-02-28 DIAGNOSIS — H538 Other visual disturbances: Secondary | ICD-10-CM | POA: Diagnosis not present

## 2024-02-28 DIAGNOSIS — E782 Mixed hyperlipidemia: Secondary | ICD-10-CM | POA: Diagnosis not present

## 2024-02-28 DIAGNOSIS — Z6825 Body mass index (BMI) 25.0-25.9, adult: Secondary | ICD-10-CM | POA: Diagnosis not present

## 2024-02-28 DIAGNOSIS — E1169 Type 2 diabetes mellitus with other specified complication: Secondary | ICD-10-CM | POA: Diagnosis not present

## 2024-04-23 NOTE — Progress Notes (Signed)
 Fernando George                                          MRN: [id number]   04/23/2024   The [facility] Quality Team Specialist reviewed this patient medical record for the purposes of chart review for care gap closure. The following were reviewed: chart review for care gap closure-kidney health evaluation for diabetes:eGFR  and uACR.    [facility] Quality Team
